# Patient Record
Sex: Female | Born: 1955 | Race: White | Hispanic: No | Marital: Married | Smoking: Former smoker
Health system: Southern US, Community
[De-identification: ages and names within clinical notes are randomized; demographics above are authoritative.]

## PROBLEM LIST (undated history)

## (undated) HISTORY — PX: CHOLECYSTECTOMY: SHX55

---

## 2018-07-15 ENCOUNTER — Emergency Department (HOSPITAL_BASED_OUTPATIENT_CLINIC_OR_DEPARTMENT_OTHER): Payer: Federal, State, Local not specified - PPO

## 2018-07-15 ENCOUNTER — Emergency Department (HOSPITAL_BASED_OUTPATIENT_CLINIC_OR_DEPARTMENT_OTHER)
Admission: EM | Admit: 2018-07-15 | Discharge: 2018-07-15 | Disposition: A | Discharge status: Home / Self Care | Payer: Federal, State, Local not specified - PPO | Attending: Emergency Medicine | Admitting: Emergency Medicine

## 2018-07-15 ENCOUNTER — Encounter (HOSPITAL_BASED_OUTPATIENT_CLINIC_OR_DEPARTMENT_OTHER): Payer: Self-pay | Admitting: Emergency Medicine

## 2018-07-15 ENCOUNTER — Other Ambulatory Visit: Payer: Self-pay

## 2018-07-15 DIAGNOSIS — S22000A Wedge compression fracture of unspecified thoracic vertebra, initial encounter for closed fracture: Secondary | ICD-10-CM | POA: Diagnosis not present

## 2018-07-15 DIAGNOSIS — Y9344 Activity, trampolining: Secondary | ICD-10-CM | POA: Diagnosis not present

## 2018-07-15 DIAGNOSIS — S3992XA Unspecified injury of lower back, initial encounter: Secondary | ICD-10-CM | POA: Diagnosis present

## 2018-07-15 DIAGNOSIS — X509XXA Other and unspecified overexertion or strenuous movements or postures, initial encounter: Secondary | ICD-10-CM | POA: Insufficient documentation

## 2018-07-15 DIAGNOSIS — Y999 Unspecified external cause status: Secondary | ICD-10-CM | POA: Diagnosis not present

## 2018-07-15 DIAGNOSIS — Y929 Unspecified place or not applicable: Secondary | ICD-10-CM | POA: Diagnosis not present

## 2018-07-15 MED ORDER — HYDROCODONE-ACETAMINOPHEN 5-325 MG PO TABS: 1.0000 | ORAL_TABLET | Freq: Four times a day (QID) | ORAL | 0 refills | Status: AC | PRN

## 2018-07-15 NOTE — ED Triage Notes (Signed)
Reports back injury which occurred on Saturday while jumping on the trampoline.  C/o lower back pain.

## 2018-07-15 NOTE — Discharge Instructions (Signed)
Return here as needed. Follow up with the doctor provided.  °

## 2018-07-15 NOTE — ED Provider Notes (Signed)
[facility] EMERGENCY DEPARTMENT Provider Note   CSN: [id number] Arrival date & time: 07/15/18  1802    History   Chief Complaint Chief Complaint  Patient presents with   Back Injury    HPI Grace Allen is a 63 y.o. female.     HPI Patient presents to the emergency department with back pain that started after jumping on trampoline this past Saturday.  Patient states that she was jumping higher and higher and she came down and landed on her feet and felt a pop in her lower back.  Patient states that she started having pain at that time.  She states that she felt like it continued to persist and she was concerned so that brought her to the emergency department today.  The patient states that certain movements and palpation make the pain worse.  The patient denies chest pain, shortness of breath, headache,blurred vision, neck pain, fever, cough, weakness, numbness, dizziness, anorexia, edema, abdominal pain, nausea, vomiting, diarrhea, rash,  dysuria, hematemesis, bloody stool, near syncope, or syncope. History reviewed. No pertinent past medical history.  There are no active problems to display for this patient.   Past Surgical History:  Procedure Laterality Date   CHOLECYSTECTOMY       OB History   No obstetric history on file.      Home Medications    Prior to Admission medications   Medication Sig Start Date End Date Taking? Authorizing Provider  HYDROcodone-acetaminophen (NORCO/VICODIN) 5-325 MG tablet Take 1 tablet by mouth every 6 (six) hours as needed for moderate pain. 07/15/18   Charlestine Night, PA-C    Family History History reviewed. No pertinent family history.  Social History Social History   Tobacco Use   Smoking status: Former Smoker   Smokeless tobacco: Never Used  Substance Use Topics   Alcohol use: Yes    Frequency: Never   Drug use: Never     Allergies   Patient has no known allergies.   Review of Systems Review  of Systems All other systems negative except as documented in the HPI. All pertinent positives and negatives as reviewed in the HPI.  Physical Exam Updated Vital Signs BP 140/76 (BP Location: Right Arm)    Pulse 72    Temp 98.2 F (36.8 C) (Oral)    Resp 16    Ht 5\' 8"  (1.727 m)    Wt 77.1 kg    SpO2 100%    BMI 25.85 kg/m   Physical Exam Vitals signs and nursing note reviewed.  Constitutional:      General: She is not in acute distress.    Appearance: She is well-developed.  HENT:     Head: Normocephalic and atraumatic.  Eyes:     Pupils: Pupils are equal, round, and reactive to light.  Neck:     Musculoskeletal: Normal range of motion and neck supple.  Cardiovascular:     Rate and Rhythm: Normal rate and regular rhythm.     Heart sounds: Normal heart sounds. No murmur. No friction rub. No gallop.   Pulmonary:     Effort: Pulmonary effort is normal. No respiratory distress.     Breath sounds: Normal breath sounds. No wheezing.  Musculoskeletal:     Lumbar back: She exhibits tenderness, bony tenderness and pain. She exhibits no deformity.       Back:  Skin:    General: Skin is warm and dry.     Capillary Refill: Capillary refill takes less than 2 seconds.  Findings: No erythema or rash.  Neurological:     Mental Status: She is alert and oriented to person, place, and time.     Sensory: Sensation is intact. No sensory deficit.     Motor: Motor function is intact. No weakness or abnormal muscle tone.     Coordination: Coordination is intact. Coordination normal.     Gait: Gait is intact. Gait normal.     Deep Tendon Reflexes:     Reflex Scores:      Patellar reflexes are 2+ on the right side and 2+ on the left side.      Achilles reflexes are 2+ on the right side and 2+ on the left side. Psychiatric:        Behavior: Behavior normal.      ED Treatments / Results  Labs (all labs ordered are listed, but only abnormal results are displayed) Labs Reviewed - No data  to display  EKG None  Radiology Dg Lumbar Spine Complete  Result Date: 07/15/2018 CLINICAL DATA:  Injury from trampoline 3 days prior. Continued generalized lumbar pain. Does not radiate. No prior injury EXAM: LUMBAR SPINE - COMPLETE 4+ VIEW COMPARISON:  None. FINDINGS: Acute appearing mild compression fracture of T12 with mild loss of the anterior and central vertebral body height from upper endplate compression. No evidence of a retropulsed bony fragment. No other fractures. No spondylolisthesis.  No bone lesion. Lumbar discs are well maintained in height. There are facet degenerative changes at L4-L5 and L5-S1. Minor endplate osteophytes are noted throughout the lumbar spine. IMPRESSION: 1. Acute mild compression fracture T12. No other fractures. No spondylolisthesis. Electronically Signed   By: Amie Portland M.D.   On: 07/15/2018 19:12    Procedures Procedures (including critical care time)  Medications Ordered in ED Medications - No data to display   Initial Impression / Assessment and Plan / ED Course  I have reviewed the triage vital signs and the nursing notes.  Pertinent labs & imaging results that were available during my care of the patient were reviewed by me and considered in my medical decision making (see chart for details).        Patient was referred to neurosurgery.  Patient has a minimal compression fracture noted on plain films.  Patient does not have any neurological deficits noted on exam.  I did advise her to return here as needed.  Patient agrees the plan and all questions were answered  Final Clinical Impressions(s) / ED Diagnoses   Final diagnoses:  Compression fracture of body of thoracic vertebra [facility])    ED Discharge Orders         Ordered    HYDROcodone-acetaminophen (NORCO/VICODIN) 5-325 MG tablet  Every 6 hours PRN     07/15/18 1925           Charlestine Night, PA-C 07/15/18 2046    Long, Arlyss Repress, MD 07/15/18 2102

## 2019-11-19 IMAGING — DX LUMBAR SPINE - COMPLETE 4+ VIEW
5 series · 5 of 5 positions shown · non-contrast
Comparison: None.

CLINICAL DATA: Injury from trampoline 3 days prior. Continued
generalized lumbar pain. Does not radiate. No prior injury

EXAM:
LUMBAR SPINE - COMPLETE 4+ VIEW

[l-spine ap]
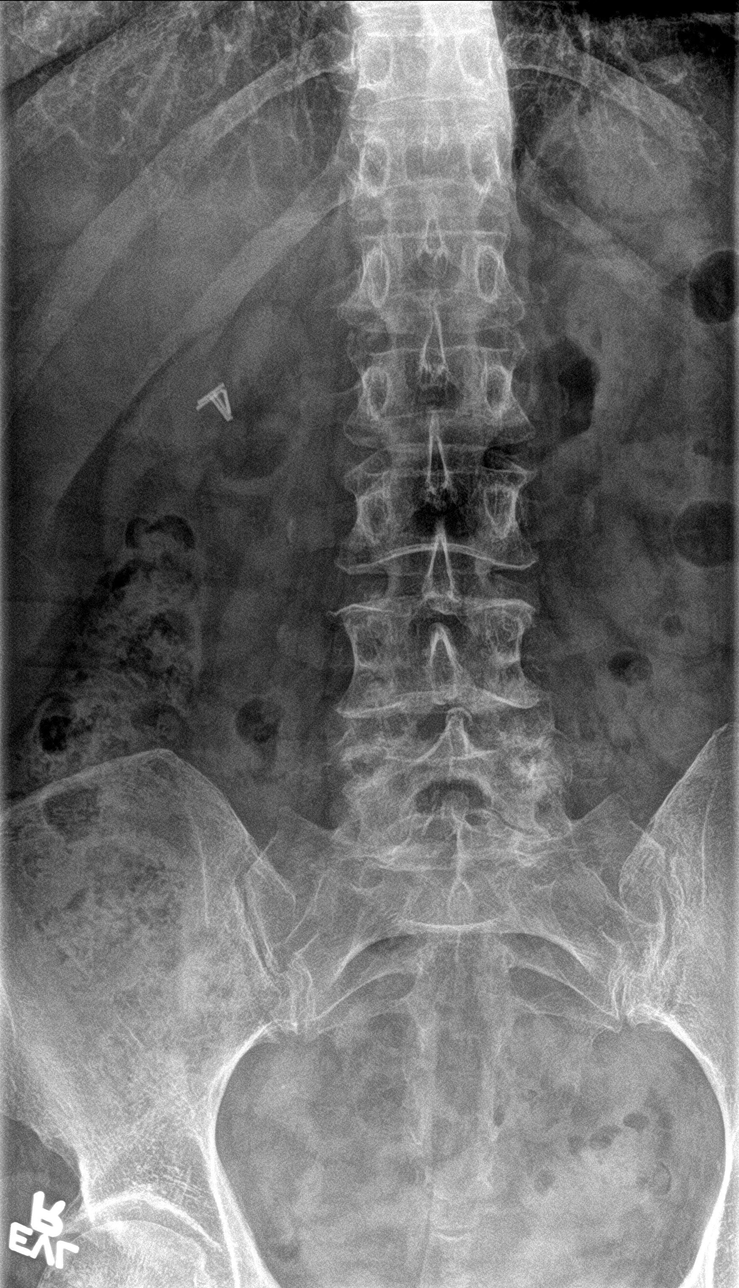

[l-spine obl (1 of 2)]
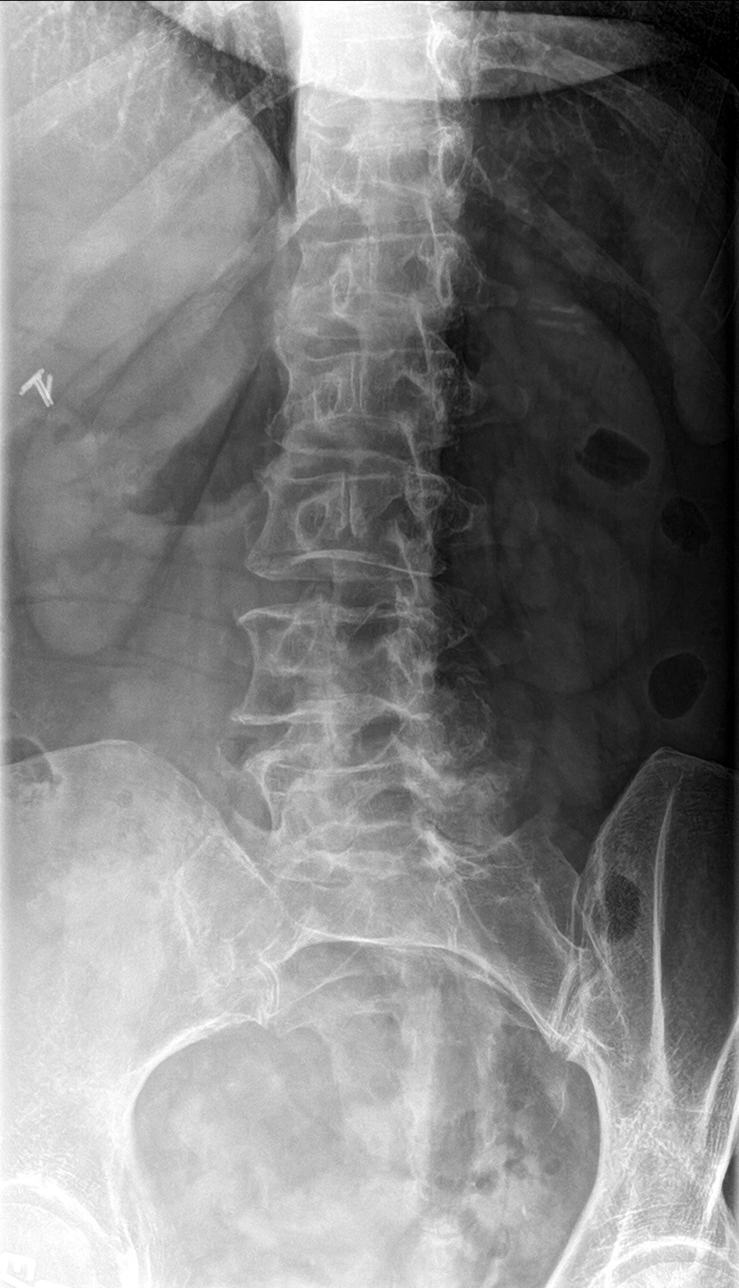

[l-spine obl (2 of 2)]
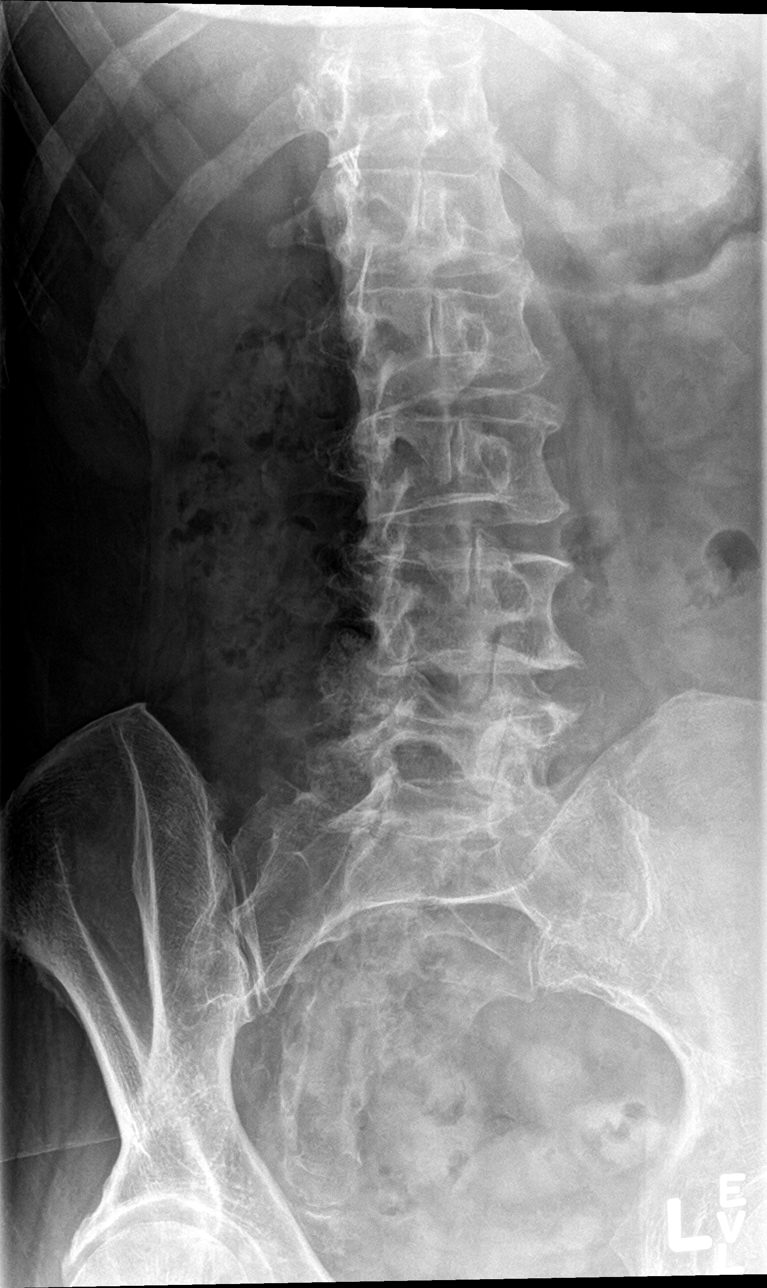

[l-spine lat]
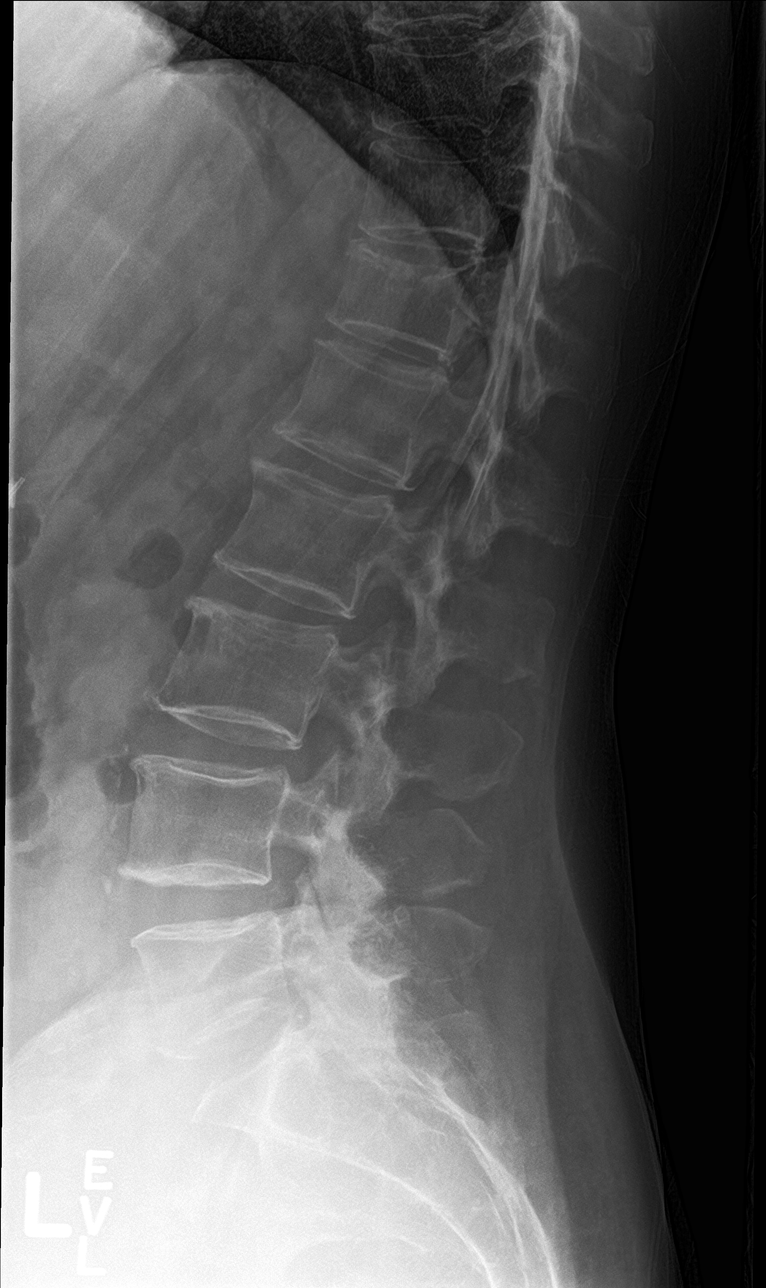

[l-spine spot]
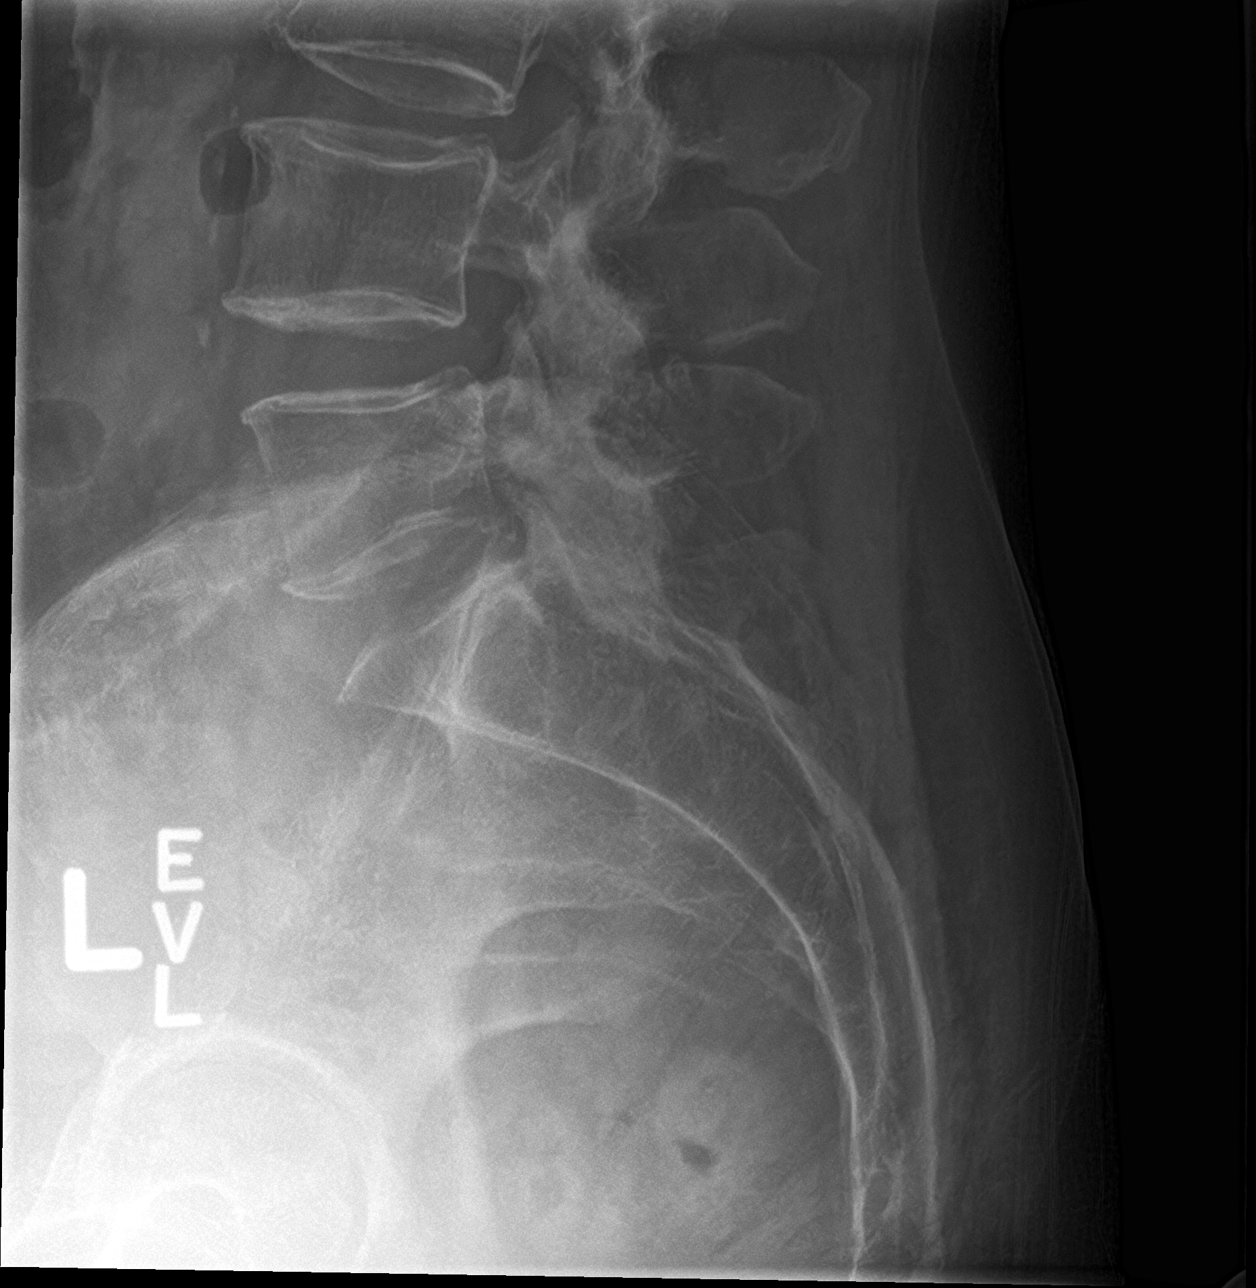

[5 of 5 positions shown; findings below may reference images not displayed]

FINDINGS: Acute appearing mild compression fracture of T12 with mild loss of
the anterior and central vertebral body height from upper endplate
compression. No evidence of a retropulsed bony fragment. No other
fractures.

No spondylolisthesis.  No bone lesion.

Lumbar discs are well maintained in height. There are facet
degenerative changes at L4-L5 and L5-S1. Minor endplate osteophytes
are noted throughout the lumbar spine.
IMPRESSION: 1. Acute mild compression fracture T12. No other fractures. No
spondylolisthesis.
